# Patient Record
Sex: Female | Born: 1995 | Hispanic: Yes | Marital: Single | Smoking: Never smoker
Health system: Western US, Academic
[De-identification: ages and names within clinical notes are randomized; demographics above are authoritative.]

## PROBLEM LIST (undated history)

## (undated) DIAGNOSIS — H05019 Cellulitis of unspecified orbit: Secondary | ICD-10-CM

## (undated) DIAGNOSIS — B2790 Infectious mononucleosis, unspecified without complication: Secondary | ICD-10-CM

## (undated) HISTORY — DX: Cellulitis of unspecified orbit: H05.019

---
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
## 2020-06-15 ENCOUNTER — Other Ambulatory Visit: Payer: BLUE CROSS/BLUE SHIELD | Attending: Emergency Medicine | Admitting: Emergency Medicine

## 2020-06-15 ENCOUNTER — Encounter (INDEPENDENT_AMBULATORY_CARE_PROVIDER_SITE_OTHER): Payer: Self-pay

## 2020-06-15 VITALS — BP 114/57 | HR 59 | Temp 98.0°F | Resp 16

## 2020-06-15 DIAGNOSIS — R0981 Nasal congestion: Secondary | ICD-10-CM | POA: Insufficient documentation

## 2020-06-15 DIAGNOSIS — Z20822 Contact with and (suspected) exposure to covid-19: Secondary | ICD-10-CM | POA: Insufficient documentation

## 2020-06-15 LAB — EMMI, COVID-19: EMMI Video Order Number: 10104986004

## 2020-06-15 MED ORDER — PSEUDOEPHEDRINE-GUAIFENESIN 60-600 MG OR TB12
1.0000 | ORAL_TABLET | Freq: Two times a day (BID) | ORAL | 0 refills | Status: AC
Start: 2020-06-15 — End: 2020-06-22

## 2020-06-15 NOTE — Progress Notes (Signed)
[facility] ATTENDING NOTE    Brenda Griffith  MRN: [id number]  DOB: [birth year]  PMD: No Pcp, Per Patient    CC: Cough (x2d, requesting PCR:  NOT VACCINATED )    Chief Complaint   Patient presents with    Cough     x2d, requesting PCR:  NOT VACCINATED        HPI: Brenda Griffith is a 24 year old female  who presents with above.  Mild general upper respiratory symptoms that she normally would have treated at home but has concerns about COVID and would like to get tested.  She has a runny nose as well as a cough.  The runny nose does not seem to be responding well to the DayQuil she is using.      Prior Epic records and lab/radiology results (and/or CareEverywhere records) were reviewed when available and germane to the chief complaint for this visit.    PMHx: generally healthy  PSHx:  has no past surgical history on file.  Shx:   Social History     Socioeconomic History    Marital status: Single     Spouse name: Not on file    Number of children: Not on file    Years of education: Not on file    Highest education level: Not on file   Occupational History    Not on file   Tobacco Use    Smoking status: Never Smoker    Smokeless tobacco: Never Used   Substance and Sexual Activity    Alcohol use: Not Currently    Drug use: Not on file    Sexual activity: Not on file   Social Activities of Daily Living Present    Not on file   Social History Narrative    Not on file     FamHx: family history is not on file.  Meds: has a current medication list which includes the following prescription(s): pseudoephedrine-guaifenesin.  ALL: Cephalexin        Review of Systems   Constitutional: Negative for fever.   Respiratory: Positive for cough.          EXAM  BP 114/57 (BP Location: Left arm, BP Patient Position: Sitting, BP cuff size: Regular)    Pulse 59    Temp 98 F (36.7 C) (Oral)    Resp 16    SpO2 100%     Vitals noted     GEN nad a&o nontoxic well appearing,   HEENT NC/AT ,   EYE: perrla, eomi,anicteric  CV: regular  rate  LUNGS: speaks in full sentences, no excessive work of breathing    EXT no edema noted, nl rom bilat UE and LE,   SKIN no acute appearing rash or lesions noted on regions examined   NEURO mae nl mentation speech and gait        NEW MEDICATIONS             pseudoephedrine-guaifenesin (MUCINEX D) 60-600 MG per tablet Take 1   tablet by mouth every 12 hours for 7 days.         A/P:   Well appearing patient with     ICD-10-CM ICD-9-CM    1. Encounter for laboratory testing for COVID-19 virus  Z20.822 V01.79 COVID-19 Coronavirus Detection Assay at [facility], Understanding Coronavirus Disease 2019   2. Nasal congestion  R09.81 478.19 pseudoephedrine-guaifenesin (MUCINEX D) 60-600 MG per tablet     Patient to  F/U PMD   Return precautions reviewed, the patient understands and agrees with the plan.

## 2020-06-15 NOTE — Interdisciplinary (Signed)
Called pt for intake questions. Pt waiting in car.

## 2020-06-15 NOTE — Patient Instructions (Signed)
PATIENT INFORMATION  Viral Upper Respiratory Illness (Viral URI or a "Cold")    What are symptoms of an upper respiratory illness?    The upper respiratory tract includes the sinuses, nasal passages and throat. Upper respiratory infections are one of the most frequent causes of doctor's visits with varying symptoms ranging from runny nose, sore throat, cough, breathing difficulty and extreme tiredness.     Although upper respiratory infections can happen at any time, they are most common in the fall and winter months, from September until March.    The majority of upper respiratory infections are due to transient viral infections of the upper respiratory tract, and DON'T REQUIRE ANTIBIOTICS. Most often upper respiratory infections are contagious and can spread from person to person by inhaling respiratory droplets from coughing or sneezing. The transmission can also occur by touching the nose or mouth by hand or other object exposed to the virus.    Some people get the "cold" and the "flu" confused; this is a table to help decide which one you might have.    Frequent Common Cold & Viral Upper Respiratory Infection (URI) Symptoms    Symptom Cold Flu   Fever Rare. Usually < 101º F Characteristics 102-104 º F   Clear, runny nose Prominent at outset Can be present   Headache Rare Prominent at outset   General aches, pains Slight Usual; often severe   Fatigue, weakness Quite mild Can last up to 2-3 weeks   Extreme exhaustion Never Early and prominent   Chest discomfort Mild to moderate Common; often severe   Complications Sinusitis, ear infections Bronchitis, pneumonia     What should I do if I think that I have a viral upper respiratory infection (URI)?    Get plenty of rest and drink plenty of fluids. Also consider one or more of the following medications, which are available without a prescription:    Symptom(s)                                       Over-the-Counter Medication  Fever and pain Acetaminophen (Tylenol ®)  is generally preferred. Ibuprofen (Advil ®) and/or naproxen (Naprosyn ®) are other options   "Stuffy," clogged nose Nasal decongestant (Afrin ®, Neosynephrine ®, or similar store brands) work within 20 minutes and last 12 hours. Local application minimizes systemic side effects. Do not use more than 3 days in a row.   Oral decongestant (pseudoephedrine [Sudafed®], others).   Note: these products can be associated with insomnia, nervousness, and irritability in some patients. Often decongestants are combined with other drugs (especially antihistamines) in OTC medications. "-D" at the end of a medication's name suggest that the medication includes an oral decongestant.   Blowing your nose easier Guaifenesin (Robitussin ®, Mucofen ®, Humibid LA ®, Mucinex ®, Humibid-e ®). These products thin mucous and can help thin any thick or discolored drainage.   Suppress coughing Dextromethorphan can be helpful as a cough suppressant, especially for nighttime use.   Combination medications (Nyquil ®, Tylenol Cold ® & Sinus ®, others) can provide significant relief. Be sure to read product labels to find the best cold preparation to match your symptoms and to determine if that medicine is safe for you. They often contain a sedating antihistamine, so avoid driving or use of machinery after administration.     When should I seek treatment?    Viral infections can   sometimes be associated with bacterial overgrowth and occasionally lead to bacterial infection (bronchitis, ear infections, sinusitis), which typically requires antibiotic therapy. Viral URIs also may worsen asthma symptoms (wheezing) in patients with asthma; such symptoms also require further evaluation and treatment.  Seek medical advice or treatment if:  · Symptoms are getting worse after 7 days  · Symptoms are unchanged or getting worse after 10 days  · You experience shortness of breath or have any respiratory difficulty  · You experience a high fever (> 101.5 ºF or  38.5 ºC)  · You develop severe head or facial pain/swelling    How can I prevent viral URIs?  · Wash your hands frequently  Cold and flu viruses are spread by touching infected persons that have come in contact with the virus and then touching one's nose or mouth. Frequent hand washing is important to prevent this process. Inhalation of infected particles in the air also can spread colds/respiratory viral infections, so watch close contacts who are coughing or sneezing.

## 2020-06-16 ENCOUNTER — Ambulatory Visit
Payer: BLUE CROSS/BLUE SHIELD | Attending: Obstetrics & Gynecology | Admitting: Student in an Organized Health Care Education/Training Program

## 2020-06-16 ENCOUNTER — Encounter (HOSPITAL_BASED_OUTPATIENT_CLINIC_OR_DEPARTMENT_OTHER): Payer: Self-pay | Admitting: Student in an Organized Health Care Education/Training Program

## 2020-06-16 ENCOUNTER — Other Ambulatory Visit: Payer: Self-pay

## 2020-06-16 VITALS — BP 95/64 | HR 86 | Temp 97.6°F | Resp 16 | Ht 62.0 in | Wt 160.0 lb

## 2020-06-16 DIAGNOSIS — Z975 Presence of (intrauterine) contraceptive device: Secondary | ICD-10-CM | POA: Insufficient documentation

## 2020-06-16 DIAGNOSIS — Z124 Encounter for screening for malignant neoplasm of cervix: Secondary | ICD-10-CM | POA: Insufficient documentation

## 2020-06-16 DIAGNOSIS — Z3200 Encounter for pregnancy test, result unknown: Secondary | ICD-10-CM | POA: Insufficient documentation

## 2020-06-16 DIAGNOSIS — Z139 Encounter for screening, unspecified: Secondary | ICD-10-CM | POA: Insufficient documentation

## 2020-06-16 DIAGNOSIS — Z113 Encounter for screening for infections with a predominantly sexual mode of transmission: Secondary | ICD-10-CM | POA: Insufficient documentation

## 2020-06-16 DIAGNOSIS — Z0000 Encounter for general adult medical examination without abnormal findings: Secondary | ICD-10-CM

## 2020-06-16 LAB — COVID-19 CORONAVIRUS DETECTION ASSAY AT ~~LOC~~ LAB: COVID-19 Coronavirus Result: NOT DETECTED

## 2020-06-16 LAB — URINE PREGNANCY TEST POCT: HCG, Urine: NEGATIVE

## 2020-06-16 NOTE — Progress Notes (Signed)
I saw and examined the patient, and discussed the case with the resident/fellow. I agree with the final findings and plan as documented in the above resident note. I was present for critical portions of the the exam and we formulated the assessment and plan together. Any additions or revisions are included in the record.  Here for new WWE  Pap today  STI screen declined.  She does not check for IUD strings, was amenorrheic for 1st 3 years, and then had more cramping in the last year  No IUD strings were seen nor palpable  Pelvic u/s ordered: obtain prior to 06/29/20.  RTC for discussion of u/s and contraception (has appt 06/29/20)  Backup condoms until u/s results.    Renae Gloss, MD  Attending Physician  06/16/2020 8:38 AM

## 2020-06-16 NOTE — Progress Notes (Deleted)
WELL WOMAN EXAM    Brenda Griffith is a  24 year old No obstetric history on file. who presents to the clinic for a routine well-woman evaluation.      SUBJECTIVE  ***    Past OB History:  OB History   No obstetric history on file.       GYN history:   Menstrual history:    Menarche: ***  LMP ***; cycle:***  Dysmenorrhea: ***  IMB: ***    Contraception:  {CONTRACEPTION:706}  History of STI (including genital HSV): {YES**/NO:60}  Last pap smear: ***  History of abnormal pap smear: {YES**/NO:60}  Date:***  Dyspareunia: ***  Sexually Active: ***  Current Birth Control Method [facility]): ***  History of hormone replacement therapy {YES/NO:63}  Estrogen contraindications: none; including no h/o migraine w/aura, tobacco, HTN, blood clots or liver dz  FHx: no breast, ovarian, uterine or colon [place], pelvic infections, or pelvic inflammatory disease:  {Yes/No:60}.    Medical History:  No past medical history on file.    Surgical History:  No past surgical history on file.    Family History:   No family history on file.    Social History:  Occupation:   Lives with:   EtOH:  Tobacco:  Marijuana:   Other substances:  Dietary assessment:  ***  Weight/nutritional/exercise counseling:  ***  Seat belt use/texting while in the car:  ***/***  Domestic Violence Screen:  ***She states that she feels safe at home    Medications:  Current Outpatient Medications on File Prior to Visit   Medication Sig Dispense Refill    pseudoephedrine-guaifenesin (MUCINEX D) 60-600 MG per tablet Take 1 tablet by mouth every 12 hours for 7 days. 14 tablet 0     No current facility-administered medications on file prior to visit.       Allergies:   Cephalexin    ROS  Denies headache, changes in vision, blurry vision, trouble speaking, trouble swallowing, chest pain, palpitations, peripheral edema, chest tightness, wheezing, cough, fevers, chills, lightheadedness, dizziness, abdominal pain, nausea, vomiting, diarrhea, constipation, numbness, tingling, or difficulty  ambulating.       OBJECTIVE  There were no vitals taken for this visit.    Physical exam:  Gen: well-appearing female in NAD  HEENT: PERRL, EOMi  CV: RRR, no murmurs  Resp: CTAB, normal WOB  Breast: breasts symmetric, no nodules/masses, no skin or nipple changes ***  Abd: soft, nontender, nondistended  Extremities: no peripheral edema, warm, dry  Pelvic exam:     EGBUS:{EGBUS:11865::"Normal female, no lesions"}     Vagina:{VAGINA (OB):963}     Cervix:{CERVIX3:11866::"No lesions or cervical motion tenderness"}     Uterus:{UTERUS NORMAL BRIEF:120995::"Normal shape, position and consistency"}     Adnexa:{ADNEXA NORMAL BRIEF:120996::"No masses, nodularity, tenderness"}    General Health Screening:  Mammogram: due at 23 yo  DEXA: due at 22 yo  Colonoscopy: due at 22 yo, or 10 years from +FHx  Cholesterol screen: at 64 or 20 if high risk  Diabetes screen: RF or at 22 years old  Thyroid screen: at 21 yo  Last mammogram was *** on ***.      Vaccinations  HPV ***  Tdap ***  Pneumococcal polysaccharide (for women 65 years or older) ***  Influenza ***    ASSESSMENT/PLAN    Cervical cancer screening: ***  Contraception: ***  STD screening: ***  HCM: ***  Vaccinations: ***  Follow up: ***    Patient was seen and examined with Dr. Marland Kitchen  Lucile Shutters, MD  Internal Medicine, PGY-1  [phone]

## 2020-06-16 NOTE — Patient Instructions (Addendum)
Lovely meeting you today!  - Please obtain the ultrasound to evaluate for your IUD placement  - Check a urine pregnancy test  - Please use condoms in the meanwhile  - Return to clinic for birth control planning or sooner if any concerns

## 2020-06-16 NOTE — Progress Notes (Signed)
WELL WOMAN EXAM    Brenda Griffith is a  24 year old No obstetric history on file. who presents to the clinic for a routine well-woman evaluation.      SUBJECTIVE  Pt with mirena IUD placed in 2017. Reports it was working well for her for 3 years and was not having periods. A year ago, began having intense cramping, and heavier, irregular bleeding. She is interested in other forms of contraception, has follow up appointment.     Past OB History:  OB History   Gravida Para Term Preterm AB Living   0 0 0 0 0 0   SAB IAB Ectopic Multiple Live Births   0 0 0 0 0       GYN history:   Menstrual history:    LMP 06/04/20, irregular   Dysmenorrhea: yes  IMB: irregular periods     Contraception:  IUD mirena  History of STI (including genital HSV): no  Last pap smear: 2017 (may have been pelvic exam)  History of abnormal pap smear: No  Dyspareunia: yes, cramping for 1 year   Sexually Active: no  Current Birth Control Method [facility]): mirena IUD  History of hormone replacement therapy No  Estrogen contraindications: none; including no h/o migraine w/aura, tobacco, HTN, blood clots or liver dz  FHx: no breast, ovarian, uterine or colon [place], pelvic infections, or pelvic inflammatory disease:  No    Medical History:  Past Medical History:   Diagnosis Date    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    Family History:   Family History   Problem Relation Name Age of Onset    Diabetes Mother      Hypertension Mother      Diabetes Father         Social History:  Occupation: Chief of Staff  Lives with: partner  EtOH: 2 drinks/mo  Tobacco: denies  Marijuana: denies  Other substances: denies   Dietary assessment: no  Weight/nutritional/exercise counseling:  Spins twice a week  Seat belt use/texting while in the car:  Yes/denies  Domestic Violence Screen: She states that she feels safe at home    Medications:  Current Outpatient Medications on File Prior to Visit   Medication Sig  Dispense Refill    pseudoephedrine-guaifenesin (MUCINEX D) 60-600 MG per tablet Take 1 tablet by mouth every 12 hours for 7 days. 14 tablet 0     No current facility-administered medications on file prior to visit.       Allergies:   Cephalexin    ROS  Denies headache, changes in vision, blurry vision, trouble speaking, trouble swallowing, chest pain, palpitations, peripheral edema, chest tightness, wheezing, cough, fevers, chills, lightheadedness, dizziness, abdominal pain, nausea, vomiting, diarrhea, constipation, numbness, tingling, or difficulty ambulating.       OBJECTIVE  Blood pressure 95/64, pulse 86, temperature 97.6 F (36.4 C), resp. rate 16, height 5\' 2"  (1.575 m), weight 72.6 kg (160 lb), last menstrual period 06/04/2020, SpO2 97 %.    Physical exam:  Gen: well-appearing female in NAD  HEENT: PERRL, EOMi  CV: RRR, no murmurs  Resp: CTAB, normal WOB  Breast: breasts symmetric, no nodules/masses, no skin or nipple changes   Abd: soft, nontender, nondistended  Extremities: no peripheral edema, warm, dry  Pelvic exam:     EGBUS:Normal female, no lesions     Vagina:Normal mucosa, no discharge  Cervix:No lesions or cervical motion tenderness Unable to locate IUD strings.     Uterus:Normal shape, position and consistency     Adnexa:No masses, nodularity, tenderness    General Health Screening:  Mammogram: due at 24 yo  DEXA: due at 24 yo  Colonoscopy: due at 24 yo  Cholesterol screen: at 24 yo  Diabetes screen: at 24 years old  Thyroid screen: at 24 yo    Vaccinations  HPV completed   Tdap: will follow with PCP  Pneumococcal polysaccharide (for women 65 years or older)  Influenza: declines today, will get at next visit     ASSESSMENT/PLAN  This is a healthy 24 year old female presenting for well woman exam.     Contraception  Pt with mirena IUD placed in 2017, reporting increased abd cramping and heavier bleeding for last year. Unable to locate strings on exam. C/f malposition, expulsion.  - Pelvic  ultrasound  - If pelvic ultrasound without IUD, will need Abd XR  - Urine pregnancy  - Recommend pt use condoms in meanwhile    Cervical cancer screening: pap today  - f/u cytopath, HPV    Contraception: mirena IUD as above, condoms    STD screening: declined    HCM: up to date    Vaccinations: flu due    Follow up: has appt on 10/28 for follow up to discuss birth control options and f/u ultrasound    Patient was seen and examined with Dr. Fredirick Maudlin.     Ansel Bong, MD  Internal Medicine, PGY-1  [phone]

## 2020-06-23 ENCOUNTER — Encounter (HOSPITAL_BASED_OUTPATIENT_CLINIC_OR_DEPARTMENT_OTHER): Payer: Self-pay | Admitting: Student in an Organized Health Care Education/Training Program

## 2020-06-26 ENCOUNTER — Ambulatory Visit
Admission: RE | Admit: 2020-06-26 | Discharge: 2020-06-26 | Disposition: A | Discharge status: Home / Self Care | Payer: BLUE CROSS/BLUE SHIELD | Attending: Obstetrics & Gynecology | Admitting: Obstetrics & Gynecology

## 2020-06-26 DIAGNOSIS — Z975 Presence of (intrauterine) contraceptive device: Secondary | ICD-10-CM | POA: Insufficient documentation

## 2020-06-26 DIAGNOSIS — T8332XA Displacement of intrauterine contraceptive device, initial encounter: Secondary | ICD-10-CM

## 2020-06-26 LAB — HPV HIGH RISK DNA PROBE, FEMALE
HPV High Risk Genotype 16: NOT DETECTED
HPV High Risk Genotype 18: NOT DETECTED
HPV Other High Risk Genotypes (Not type 16 or 18): DETECTED — AB

## 2020-06-27 ENCOUNTER — Encounter (HOSPITAL_BASED_OUTPATIENT_CLINIC_OR_DEPARTMENT_OTHER): Payer: Self-pay | Admitting: Student in an Organized Health Care Education/Training Program

## 2020-06-27 ENCOUNTER — Telehealth (HOSPITAL_BASED_OUTPATIENT_CLINIC_OR_DEPARTMENT_OTHER): Payer: Self-pay | Admitting: Student in an Organized Health Care Education/Training Program

## 2020-06-27 NOTE — Telephone Encounter (Signed)
Call to pt. No answer. Left message to call clinic. Call back number provided.

## 2020-06-27 NOTE — Telephone Encounter (Signed)
Caller: Pt  Phone # [phone]  Relationship to patient: Self    NOTES:  Pt had US done yesterday and received results today through My Chart and would like a call to discuss "positive" results.      Caller has been advised to please allow 24 (Urgent) /72 (Non-Urgent) hours for a response.    If response is needed please route to [facility] Scheduling

## 2020-06-28 ENCOUNTER — Telehealth (HOSPITAL_BASED_OUTPATIENT_CLINIC_OR_DEPARTMENT_OTHER): Payer: Self-pay | Admitting: Student in an Organized Health Care Education/Training Program

## 2020-06-28 NOTE — Progress Notes (Signed)
Contraception Consultation  06/29/2020    Referring provider: Self, Referred  No address on file  Primary care provider: No Pcp, Per Patient    HPI: Brenda Griffith is a 24 year old female who presents today for consultation regarding birth control.  She is currently using Mirena IUD. She specifically wishes to address the following issues today:     - The patient reports heavy, painful periods growing up. She states that initially the IUD helped with those symptoms for the first three years, but then she started having irregular bleeding which is bothersome to her as well as cramping that can at times because very painful. The bleeding does not always happen at the same time as the cramping. This has been going on the past year and a half. She has tried ibuprofen in the past for the pain which does help.   - She would like a breast exam and STI testing today  - Would like to discuss positive HPV test     BIRTH CONTROL HISTORY:  The patient has tried OCPs in the past, but had some difficulty remembering to take a pill every day. She desires a birth control method that will decrease her pain and bleeding and that she does not have to think about on a regular basis.     -Hx of MI/stroke/HTN/DVT/clotting disorder: no  -Hx of migraines with aura: no, but does report dull pain, sensitivity to light and nausea prior to migraine.   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    OBSTETRICAL HISTORY:  OB History   Gravida Para Term Preterm AB Living   0 0 0 0 0 0   SAB IAB Ectopic Multiple Live Births   0 0 0 0 0     GYNECOLOGIC HISTORY:  Patient's last menstrual period was 06/04/2020 (exact date).  Menses: irregular with Mirena. Dysmenorrhea/AUB: YES - cramping x 1.5 years and  prior to IUD  Sexual Activity: Patient is sexually active.  She has not had a new sexual partner in the last year. Oral and vaginal sex  Most recent contraception:  Mirena IUD  Pap Smear History:  Date of last pap smear was 10/15, NILM, HPV positive.   Sexually Transmitted Infections: NO  Sexual/Physical Abuse:  NO    SOCIAL HISTORY AND HABITS:  Social History     Socioeconomic History    Marital status: Single     Spouse name: Not on file    Number of children: Not on file    Years of education: Not on file    Highest education level: Not on file   Occupational History    Not on file   Tobacco Use    Smoking status: Never Smoker    Smokeless tobacco: Never Used   Substance and Sexual Activity    Alcohol use: Not Currently    Drug use: Never    Sexual activity: Yes     Partners: Male     Birth control/protection: I.U.D.   Other Topics Concern    Not on file   Social History Narrative    Not on file     Social Determinants of Health  Financial Resource Strain:     Difficulty of Paying Living Expenses: Not on file   Food Insecurity:     Worried About Charity fundraiser in the Last Year: Not on file    [employer] of Food in the Last Year: Not on file   Transportation Needs:     Lack of Transportation (Medical): Not on file    Lack of Transportation (Non-Medical): Not on file   Physical Activity:     Days of Exercise per Week: Not on file    Minutes of Exercise per Session: Not on file   Stress:     Feeling of Stress : Not on file   Social Connections:     Frequency of Communication with Friends and Family: Not on file    Frequency of Social Gatherings with Friends and Family: Not on file    Attends Religious Services: Not on file    Active Member of Clubs or Organizations: Not on file    Attends Archivist Meetings: Not on file    Marital Status: Not on file   Intimate Partner Violence:     Fear of Current or Ex-Partner: Not on file    Emotionally Abused: Not on file     Physically Abused: Not on file    Sexually Abused: Not on file   Housing Stability:     Unable to Pay for Housing in the Last Year: Not on file    Number of [place] in the Last Year: Not on file    Unstable Housing in the Last Year: Not on file       FAMILY HISTORY:  Family History   Problem Relation Name Age of Onset    Diabetes Mother      Hypertension Mother      Diabetes Father       -Fam hx of blood clots: no    ALLERGIES:  Allergies   Allergen Reactions    Cephalexin Anaphylaxis       MEDICATIONS:  No current outpatient medications on file prior to visit.     No current facility-administered medications on file prior to visit.       REVIEW OF SYSTEMS:   A review of systems was completed and is negative, except for the pertinent positives and negatives as noted above in the history of present illness.      PHYSICAL EXAM:  BP 105/67 (BP Location: Left arm, BP Patient Position: Sitting, BP cuff size: Regular)    Pulse 65    Temp 98.1 F (36.7 C) (Oral)    Resp 16    Ht $R'5\' 2"'IY$  (1.575 m)    Wt 74.8 kg (165 lb)    LMP 06/04/2020 (Exact Date)    SpO2 96%    Breastfeeding No    BMI 30.18 kg/m   General:  This is a well appearing, female in no apparent distress.   Skin:  Warm, dry and no rashes or lesions.  HEENT: Normocephalic.  Sclera anicteric.  Extraocular muscles intact.  Breast: Symmetric, no palpable masses, no nipple retraction, no skin changes, no discharge, no masses, no axillary adenopathy.   Lungs:  Non labored  Abdomen:  Soft. Non-tender.    Musculoskeletal:  Normal.   Extremities:  No edema.  WWP.  Neurological:  Normal    PELVIC EXAM:  External Genitalia:  Normal contour. No lesions.   Urethral Meatus:  Normal in appearance.  Vagina:  Well estrogenized.  Well rugated. No  lesions. Minimal this white discharge in vaginal vault.   Cervix:  No lesions.  normal in appearance.   Uterus: Anteverted. Normal size and contour.  Mobile. Non-tender.  Adnexa/Parametria:  No mass. Non-tender.  No  parametrial thickening.    IN OFFICE LABORATORY FINDINGS:      Pregnancy Test: Neg    Procedure:  IUD INSERTION and REMOVAL:  Patient has no contraindications to IUD (PID, puerperal sepsis, GC/CT, unexplained vaginal bleeding, fibroids with distorted cavity, poorly controlled HIV, Liver tumor/cirrhosis, breast/cervical cancer).     Tests (recent GC/CT, pap): most recent pap 06/16/20 NILM, other hrHPV positive  Pregnancy Test: Neg    Time out:  CONSENT  A complete discussion of the risks and benefits of IUD use and the details of the insertion procedure was held with the patient. Discussed risks of bleeding, infection, perforation, expulsion, failure (increased risk of ectopic), pain with insertion, vasovagal reaction, need for removal in the operating room. All questions were answered.  A consent form was signed.     PROCEDURE:  Timeout: 8:50 AM  Procedure Time: 8:55 AM  Correct patient: yes  Correct Procedure: yes  Correct Site: yes    Preprocedure medications: none  IUD Lot # [id number]    Time out was performed with all members of the team in attendance. The patient was placed in low lithotomy.  A bimanual exam was performed and the uterus noted to be anterior.  The speculum was placed and GC/CT swab was collected. Ultrasound guidance was utilized. The cervix was fully visualized and the IUD strings were not seen. A cytobrush was used to attempt to gently bring strings out from the endocervical canal, but the strings did not emerge. The cervix prepped with Hibeclens solution. A tenaculum was placed on the anterior lip of the cervix and the alligator forceps were used to gently grasp the IUD strings. The IUD was removed with gentle traction. The uterus sounded to 8 cm. The mirena IUD was prepared to 8 cm. An attempt was made to place the device, but resistance was noted at the flexion point of the uterus. Dr. Lyndel Safe, attending physician then attempted to place the IUD still under ultrasound guidance, but again  resistance was noted at flexion point of the uterus. An plastic os finder was used to attempt to dilate the os further, but also met resistance at the flexion point of the uterus. A metal dilator was used and entered the uterus without difficulty. Another attempt was made to place the IUD after bending the applicator device given the anteflexion of the uterus, but again met resistance. The tenaculum was replaced on the posterior lip of the cervix and additional unsuccessful attempt was made to insert the IUD under ultrasound guidance. The patient expressed discomfort at this time and requested the procedure be stopped. The procedure was discontinued, the tenaculum was removed and the cervix was noted to be hemostatic after applying pressure with cotton swab. The speculum was removed. The patient tolerated the procedure well and desires another attempt at a future visit.     Dr.Gupta, attending physician was present and involved in the entire procedure.    ASSESSMENT/PLAN:    #. Contraception: After long discussion about different LARC options including Mirena IUD, Nexplanon or Depo Provera including common side effects, the patient chose to proceed with Mirena IUD removal and replacement. S/p successful Mirena IUD removal and attempted, but failed reinsertion. The patient declined a paracervical block today and tolerated the procedure very well, but  the procedure was prolonged due to difficulty placing the IUD despite ultrasound guidance. The patient desires another attempt to place Mirena next week under ultrasound guidance and with a paracervical block.   -- Discussed importance of using a back up contraception or abstinence for the next week until successful IUD placement.  -- Assisted with scheduling the patient in one week to Mirena IUD placement. Will plan to do the procedure under ultrasound guidance and a paracervical block. Encouraged the patient to take ibuprofen prior to presenting for her visit next  week.     #. STI: STI testing sent today, will call or message patient with results.   #. Cervical Cancer Screening: Pap with co-testing done on 10/15 notable for NILM with positive other hrHPV. Plan to repeat pap in one year without co-testing. If normal will resume usual screening.   #. HCM: UTD  #. Dispo: Return to clinic in one week for Mirena IUD placement.     Patient seen and discussed with Dr. Lyndel Safe.     Theda Sers, MD   PGY1  Department of Obstetrics, Gynecology, and Reproductive Sciences  Certified [place] #[id number]  PID: [id number], Pager: [phone]  06/29/20, 8:42 AM

## 2020-06-28 NOTE — Telephone Encounter (Signed)
Patient called to discuss her 06/16/20 lab test results and to find out what's the next step going forward?  Please call back patient and advise. Best number to be reached at [phone].  Ok to leave a message.

## 2020-06-28 NOTE — Telephone Encounter (Signed)
Pt called regarding previous notes and stating that she was supposed to receive a call from supervisor. Please advise, thank you.

## 2020-06-28 NOTE — Telephone Encounter (Signed)
Pt states a breast exam was not done during WWE  And that she was not screened for STD/STI, she stated she verbally consented for it, and she noted it down on registration paper work, but it was not done?    Is wondering if this can be done during contraception appt she has scheduled tomorrow on 10/28

## 2020-06-28 NOTE — Telephone Encounter (Signed)
Return call to pt regarding concerns. Spoke to pt this morning to inform of imaging results. Pt also wanted to know results of PAP/HPV d/t abnormal HPV result seen in Mychart. Informed pt that message will be sent to provider for review and recommendation with follow up care. Clinic appointment tomorrow 06/29/20. Received another call from pt regarding breast exam and STI testing not done at last visit. Message also routed to provider to inform of pt concerns. Received response from provider that concerns will be addressed at tomorrow's appt. Received another call from pt regarding request to talk to supervisor. With previous encounters, pt did not inform this Clinical research associate that she wanted to speak to supervisor. Returned pt's call to discuss all concerns and inform her that provider will address all issues at tomorrow's appointment. Given welisten number and information to discuss experience with Radiology department. Pt would also like to discuss clinic experience with supervisor. Message routed. Marland Kitchen

## 2020-06-28 NOTE — Telephone Encounter (Signed)
On the phone with pt discussing concerns.

## 2020-06-28 NOTE — Patient Instructions (Addendum)
It was so nice to meet you today!   I will message you via mychart with the results of your STI testing.     I'm so sorry we were not able to place your IUD today. We will reschedule you for next week to try again. Please be sure to use condoms or abstinence for the next week prior to your appointment.     We will repeat your pap in one year.

## 2020-06-28 NOTE — Telephone Encounter (Signed)
Caller: [name]  Phone # [phone]  Relationship to patient: Self  Notes:     Patient called to follow up in regards to her phone call from yesterday 06/27/20 (please see encounter).  Patient stated she requested for a Supervisor from [employer] to call her back.  Also, patient stated she had an incomplete WWE on 06/16/20 and was not able to get a breast exam and STD testing and has an appointment tomorrow 06/29/20 at 8:00 a.m. and requested if she can have a breast exam and STD testing when she comes to her appointment and she has not received a returned phone call.   Please call back patient and advise.  Ok to leave a message.    Caller has been advised to please allow 24 (Urgent) /72 (Non-Urgent) hours for a response.

## 2020-06-28 NOTE — Telephone Encounter (Signed)
Pt returning call  Requesting call back @ [phone]

## 2020-06-28 NOTE — Telephone Encounter (Signed)
On the phone with pt discussing her concerns.

## 2020-06-28 NOTE — Telephone Encounter (Signed)
Return call to pt and informed of ultrasound result after review from provider.     Impression:   Unremarkable pelvic ultrasound  IUD in satisfactory position in the endometrial canal.  Okay to have intercourse w/o backup contraception.    Pt also requesting pap/hpv results. Per pt, HPV came back abnormal and would like to know if she needs follow up care for it. Routing to provider who will see her tomorrow for review and recommendation.

## 2020-06-29 ENCOUNTER — Other Ambulatory Visit (HOSPITAL_BASED_OUTPATIENT_CLINIC_OR_DEPARTMENT_OTHER): Payer: BLUE CROSS/BLUE SHIELD

## 2020-06-29 ENCOUNTER — Ambulatory Visit
Payer: BLUE CROSS/BLUE SHIELD | Attending: Obstetrics & Gynecology | Admitting: Student in an Organized Health Care Education/Training Program

## 2020-06-29 ENCOUNTER — Encounter (HOSPITAL_BASED_OUTPATIENT_CLINIC_OR_DEPARTMENT_OTHER): Payer: Self-pay | Admitting: Student in an Organized Health Care Education/Training Program

## 2020-06-29 VITALS — BP 105/67 | HR 65 | Temp 98.1°F | Resp 16 | Ht 62.0 in | Wt 165.0 lb

## 2020-06-29 DIAGNOSIS — Z113 Encounter for screening for infections with a predominantly sexual mode of transmission: Secondary | ICD-10-CM

## 2020-06-29 DIAGNOSIS — Z30433 Encounter for removal and reinsertion of intrauterine contraceptive device: Secondary | ICD-10-CM | POA: Insufficient documentation

## 2020-06-29 DIAGNOSIS — Z23 Encounter for immunization: Secondary | ICD-10-CM | POA: Insufficient documentation

## 2020-06-29 DIAGNOSIS — Z30432 Encounter for removal of intrauterine contraceptive device: Secondary | ICD-10-CM | POA: Insufficient documentation

## 2020-06-29 DIAGNOSIS — Z139 Encounter for screening, unspecified: Secondary | ICD-10-CM | POA: Insufficient documentation

## 2020-06-29 LAB — HIV 1/2 ANTIBODY & P24 ANTIGEN ASSAY, BLOOD: HIV 1/2 Antibody & P24 Antigen Assay: NONREACTIVE

## 2020-06-29 LAB — URINE PREGNANCY TEST POCT: HCG, Urine: NEGATIVE

## 2020-06-29 LAB — HEPATITIS B SURFACE AG, BLOOD: HBsAg: NONREACTIVE

## 2020-06-29 LAB — HEPATITIS C AB, BLOOD: Hepatitis C Ab: NONREACTIVE

## 2020-06-29 MED ORDER — IBUPROFEN 600 MG OR TABS
600.0000 mg | ORAL_TABLET | Freq: Once | ORAL | Status: AC
Start: 2020-06-29 — End: 2020-06-30

## 2020-06-29 MED ORDER — LEVONORGESTREL 20 MCG/24HR IU IUD
INTRAUTERINE_SYSTEM | INTRAUTERINE | Status: AC
Start: 2020-06-29 — End: ?
  Filled 2020-06-29: qty 1

## 2020-06-29 MED ORDER — LEVONORGESTREL 20 MCG/24HR IU IUD
1.0000 | INTRAUTERINE_SYSTEM | Freq: Once | INTRAUTERINE | Status: AC
Start: 2020-06-29 — End: 2020-06-29
  Administered 2020-06-29 (×2): 1 via INTRAUTERINE

## 2020-06-29 MED ORDER — SILVER NITRATE-POT NITRATE 75-25 % EX MISC
CUTANEOUS | Status: AC
Start: 2020-06-29 — End: ?
  Filled 2020-06-29: qty 4

## 2020-06-29 MED ORDER — SILVER NITRATE-POT NITRATE 75-25 % EX MISC
CUTANEOUS | Status: AC
Start: 2020-06-29 — End: ?
  Filled 2020-06-29: qty 1

## 2020-06-30 LAB — SYPHILIS EIA SCREEN, BLOOD: Syphilis Screen: NEGATIVE

## 2020-06-30 NOTE — Addendum Note (Signed)
Addended by: Stefan Church on: 06/30/2020 02:39 PM     Modules accepted: Level of Service

## 2020-07-03 ENCOUNTER — Encounter (HOSPITAL_BASED_OUTPATIENT_CLINIC_OR_DEPARTMENT_OTHER): Payer: Self-pay | Admitting: Student in an Organized Health Care Education/Training Program

## 2020-07-04 ENCOUNTER — Encounter (HOSPITAL_BASED_OUTPATIENT_CLINIC_OR_DEPARTMENT_OTHER): Payer: Self-pay | Admitting: Student in an Organized Health Care Education/Training Program

## 2020-07-04 LAB — CHLAMYDIA/GONORRHEA PCR, GENITAL
Chlamydia trachomatis PCR: NOT DETECTED
Neisseria gonorrhoeae PCR: NOT DETECTED

## 2020-07-05 NOTE — Patient Instructions (Addendum)
Intrauterine Device (IUD) Insertion: After Your Visit    Your Care Instructions  The intrauterine device (IUD) is a very effective method of birth control. It is a small, plastic, T-shaped device that contains copper or hormones. The doctor inserts the IUD into your uterus. A plastic string tied to the end of the IUD hangs down through the cervix into the vagina (but not out of your vagina).    There are two types of IUDs. The copper IUD is effective for up to 10 years. The hormonal IUD is effective for either 3 years, 5 or 7 years, depending on which IUD is used. The hormonal IUD also reduces menstrual bleeding and cramping. Both types of IUD affect the ability of the sperm to move toward the egg. This means that the woman's egg does not join with the sperm. The hormonal IUD also thickens mucus in the cervix which keeps sperm from reaching the uterus and traveling to an ovary to meet an egg.    Follow-up care is a key part of your treatment and safety. Be sure to make and go to all appointments, and call your doctor if you are having problems. It's also a good idea to know your test results and keep a list of the medicines you take.      When does the IUD start working?   Copper IUD (Paragard): Starts working immediately after placement   Mirena IUD: Starts working immediately after placement   Skyla and Kyleena IUD: start working after 7 days. Be sure to use backup birth control (like a condom) for the first 7 days.      Please perform a self- string check at home 1 week after placement of your IUD.  . To check for strings, wash your hands. Then, sit or squat down. Place one finger into your vagina until you feel your cervix which is at the top of your vagina. It will feel hard and rubbery, like the end of your nose. The string ends should be coming through your cervix. Do not pull on the strings.   . If you feel the hard plastic part of the IUD or if you cannot feel the strings at all, the IUD may have moved  out of place. Please call the clinic to make an appointment for a provider string check and use a backup form of birth control (such as condoms) until you are seen. If the IUD comes out, save it and call your doctor. Be sure to use another form of birth control while the IUD is out.       How can you care for yourself at home?  You may experience some mild cramping and light bleeding for a few days. Use a hot water bottle or a heating pad set on low on your belly for pain.   Take an over-the-counter pain medicine, such as acetaminophen (Tylenol), ibuprofen (Advil, Motrin), and naproxen (Aleve) if needed. Read and follow all instructions on the label.     If you are able to take NSAIDs (ibuprofen or naproxen), it can be helpful                    to take these medications on a scheduled basis for the first 5 days after                     insertion. Take with food/milk. Suggested regimens include:                                       Naproxen 440mg (two 220mg tabs) twice daily for 5 days                                     Ibuprofen 600mg (three 200mg tabs) every 6 hours for 5 days                                     Ibuprofen 800mg (four 200mg tabs) every 8 hours for 5 days        If you cannot take NSAIDs, you can take acetaminophen (Tylenol):                                     Acetaminophen 650-1000mg every 8 hours (do not exceed            3250mg of acetaminophen in a 24 hour time period)  Use latex condoms to protect against sexually transmitted infections (STIs), such as gonorrhea and chlamydia. An IUD does not protect you from STIs. Having one sex partner (who does not have STIs and does not have sex with anyone else) is a good way to avoid STIs.  Pregnancy is unlikely after IUD placement, but can happen. Please call the clinic if you have any concerns or if your pregnancy test is positive.      It is normal to have changes to your menstrual period after having the IUD placed.    Copper IUD (Paragard): Most  women have more bleeding and cramping with their periods when they first get the Copper T IUD, but this usually lessens over time. Using a NSAID (ibuprofen or naproxen) for the first 3-5 days of your menstrual period can help decrease the bleeding and cramping. Be sure to take the NSAID with food/milk. Suggested regimens include:   Naproxen 440mg (two 220mg tabs) twice daily for 3-5 days        Ibuprofen 600mg (three 200mg tabs) every 6 hours for 3-5 days   Ibuprofen 800mg (four 200mg tabs) every 8 hours for 3-5 days   Mirena: irregular bleeding and spotting is normal for the first 4-6 months after the IUD is placed.  This bleeding can be bothersome at first but usually will become lighter over time. After 6 months, your bleeding should be 80-90% lighter. Call the clinic if your bleeding is excessive and not getting better. Approximately 30% of women will stop having menstrual bleeding after 1 year of use.    Skyla: irregular bleeding and spotting is normal for the first 4-6 months after the IUD is placed.  This bleeding can be bothersome at first but usually will become lighter over time. After 6 months, your menstrual period will be regular, but lighter. Only 6% of women will stop having a menstrual period after 1 year of use.       When should you call for help?  Call 911 anytime you think you may need emergency care. For example, call if:  You passed out (lost consciousness).   You have sudden, severe pain in your belly or pelvis.      Call your doctor now or seek immediate medical care if:  You have new belly or pelvic pain.    You have severe vaginal bleeding. You are passing clots of blood and soaking   through your usual pads or tampons every hour for 2 or more hours.   You are dizzy or lightheaded, or you feel like you may faint.   You have a fever and pelvic pain or vaginal discharge.   You have pelvic pain/cramping that is getting worse.    Watch closely for changes in your health, and be sure to contact  your doctor if:  You cannot feel the string, or the IUD comes out.   You feel sick to your stomach, or you vomit.   You think you may be pregnant      To speak to a nurse or physician, you may also call our clinic during normal business hours:   [facility] ([place]) at [phone]    [facility] ([place]) at [phone].    For emergencies after hours or weekends, call [phone] ([place] operator) and ask to speak to the resident physician on call for Gynecology.

## 2020-07-05 NOTE — Progress Notes (Addendum)
IUD Insertion Clinic Note    Brenda Griffith is a 24 year old G0 who presents for IUD insertion.    She was last seen on 10/28 for contraception counseling and opted for IUD removal and reinsertion. IUD removal was complicated, but the IUD insertion was unsuccessful due to steep anteversion of the uterus. The patient tolerated the procedure well, but after approximately 10 minutes of attempt, she not longer wanted to continue. She did not desire a paracervical block at the time of IUD removal and reinsertion initially.     She presents today for IUD insertion. Today she does desire a paracervical block and pre-medication. The procedure will be done under ultrasound guidance.     HPI:   The patient reports feeling well today. She did have some spotting after the IUD removal and attempted replacement last week, then started her period. She reports her pain resolved that day and was mild. She feels confident in proceeding with Mirena IUD insertion today. She brought her partner with her who will be able to drive her home. She does desire pre-medication with versed and a paracervical block to help with discomfort during the procedure.     Current BCM: none    Reviewed alternative methods of contraception available and patient elects for a Mirena IUD.     LMP: Patient's last menstrual period was 06/04/2020 (exact date).  Unprotected intercourse since last menses? No, started period now without Mirena IUD in place.   Pregnancy Test: Neg  GC/CT: not indicated, tested last week 10/28 and was negative.     I have reviewed patient's medical history and she has had no contraindications to Mirena IUD.     Exam:  BP 106/68 (BP Location: Left arm, BP Patient Position: Sitting, BP cuff size: Regular)    Pulse 61    Temp 99 F (37.2 C) (Oral)    Resp 16    Ht $R'5\' 2"'yO$  (1.575 m)    Wt 73.9 kg (163 lb)    LMP 06/04/2020 (Exact Date)    SpO2 97%    Breastfeeding No    BMI 29.81 kg/m   Gen: alert, pleasant, no apparent distress   Uterus:  normal sized and anterior, anteverted but more neutral position after hydration filling the bladder.   Adnexae:  no masses or tenderness    Exam performed by Dr. Dortha Kern.     A complete discussion of the risks and benefits of IUD use and the details of the insertion procedure was held with the patient. Discussed risks of bleeding, infection, perforation, expulsion, failure (increased risk of ectopic), irregular bleeding patterns, pain with insertion, vasovagal reaction. All questions were answered.  A consent form was signed.      The patient was administered 10 mg of Versed syrup >30 min prior to the procedure.     Time Out: performed at 09:57.    Correct patient:  yes  Correct procedure:  yes  Correct equipment and/or implants:  yes   Patients allergies were confirmed.    The speculum was inserted in vagina and cervix visualized. The cervix was cleansed with Hibiclens. 2 mL of 1% lidocaine buffered with bicarbonate was injected into the anterior lip of the cervix. Then the anterior lip of cervix was grasped with a single tooth tenaculum. A two point paracervical block was placed using 18 mL (1% lidocaine buffered with bicarbonate). The uterus sounded to 7 cm. An attempt was made to place the IUD, but resistance was met at the  internal os of the cervix. The cervix was dilated with Dennison dilators to 5 mm. The Mirena IUD was then inserted using sterile technique in the usual fashion without difficulty. The strings were trimmed to 3 cm. The tenaculum was removed and tenaculum sites hemostatic with silver nitrate. The patient was stable after procedure.    The procedure was performed by Dr. Dortha Kern. Dr. Arabella Merles, attending physician was present and immediately available during the procedure.     Assessment and plan:   Brenda Griffith is a 24 year old G0 who presents for IUD insertion.    #Encounter for IUD insertion:  - Successful IUD insertion under ultrasound guidance with paracervical block. See photo of uterus  with IUD in appropriate location in media.   - Reviewed efficacy, possible bleeding patterns and recommended self-check of strings occasionally.   - Instructed to use back up method x 7 days.   - Counseled  IUD does not provide STI protection and that should be combined with condom use if at risk.  - Discussed plan for self-string check in 1 week   - If the patient is unable to feel IUD strings, they have instructed to use condoms for contraception and call the clinic to schedule a formal string check  - return to clinic PRN    Theda Sers, MD   PGY1  Department of Obstetrics, Gynecology, and Reproductive Sciences  Certified [place] #[id number]  PID: [id number], Pager: [phone]  07/06/20, 8:08 AM

## 2020-07-06 ENCOUNTER — Ambulatory Visit
Payer: BLUE CROSS/BLUE SHIELD | Attending: Obstetrics & Gynecology | Admitting: Student in an Organized Health Care Education/Training Program

## 2020-07-06 ENCOUNTER — Other Ambulatory Visit: Payer: Self-pay

## 2020-07-06 ENCOUNTER — Encounter (HOSPITAL_BASED_OUTPATIENT_CLINIC_OR_DEPARTMENT_OTHER): Payer: Self-pay | Admitting: Student in an Organized Health Care Education/Training Program

## 2020-07-06 VITALS — BP 106/68 | HR 61 | Temp 99.0°F | Resp 16 | Ht 62.0 in | Wt 163.0 lb

## 2020-07-06 DIAGNOSIS — Z30430 Encounter for insertion of intrauterine contraceptive device: Secondary | ICD-10-CM | POA: Insufficient documentation

## 2020-07-06 LAB — URINE PREGNANCY TEST POCT: HCG, Urine: NEGATIVE

## 2020-07-06 MED ORDER — LIDOCAINE HCL 1 % IJ SOLN
18.0000 mL | Freq: Once | INTRAMUSCULAR | Status: AC
Start: 2020-07-06 — End: 2020-07-07

## 2020-07-06 MED ORDER — MIDAZOLAM HCL 2 MG/ML OR SYRP
10.0000 mg | ORAL_SOLUTION | Freq: Once | ORAL | Status: AC
Start: 2020-07-06 — End: 2020-07-06
  Administered 2020-07-06 (×2): 10 mg via ORAL

## 2020-07-06 MED ORDER — SODIUM BICARBONATE 8.4 % IV SOLN
2.0000 mL | Freq: Once | INTRAVENOUS | Status: AC
Start: 2020-07-06 — End: 2020-07-07

## 2020-07-06 MED ORDER — LEVONORGESTREL 20 MCG/24HR IU IUD
INTRAUTERINE_SYSTEM | INTRAUTERINE | Status: AC
Start: 2020-07-06 — End: ?
  Filled 2020-07-06: qty 1

## 2020-07-06 MED ORDER — LEVONORGESTREL 20 MCG/24HR IU IUD
1.0000 | INTRAUTERINE_SYSTEM | Freq: Once | INTRAUTERINE | Status: AC
Start: 2020-07-06 — End: 2020-07-06
  Administered 2020-07-06: 1 via INTRAUTERINE

## 2020-07-06 MED ORDER — MIDAZOLAM HCL 2 MG/ML OR SYRP
ORAL_SOLUTION | ORAL | Status: AC
Start: 2020-07-06 — End: ?
  Filled 2020-07-06: qty 5

## 2020-07-06 MED ORDER — LIDOCAINE HCL 1 % IJ SOLN
INTRAMUSCULAR | Status: AC
Start: 2020-07-06 — End: ?
  Filled 2020-07-06: qty 20
